# Patient Record
Sex: Female | Born: 1965 | Race: White | Hispanic: No | Marital: Married
Health system: Midwestern US, Community
[De-identification: ages and names within clinical notes are randomized; demographics above are authoritative.]

## PROBLEM LIST (undated history)

## (undated) DIAGNOSIS — F32A Depression, unspecified: Secondary | ICD-10-CM

## (undated) DIAGNOSIS — F329 Major depressive disorder, single episode, unspecified: Secondary | ICD-10-CM

## (undated) DIAGNOSIS — F909 Attention-deficit hyperactivity disorder, unspecified type: Secondary | ICD-10-CM

---

## 2014-03-21 NOTE — Progress Notes (Signed)
[facility] [address]   [address], [place], [place] [id number]  Phone:[phone]   Fax:[phone]  Outpatient PHYSICAL THERAPY: Initial Assessment  Fall Risk Score: 0 (? 5 = High Risk)    Treatment Diagnosis: pain in joint, lower leg  Treatment Diagnosis 2: stiffness of joint not elsewhere classified involving unspecified site  REFERRING PHYSICIAN: Herschel SenegalKoch, Benjamin S, MD  MD Orders: evaluate and treat left knee pain and left hip weakness   Return Physician Appointment: 1 month from evaluation  MEDICAL/REFERRING DIAGNOSIS: see above  DATE OF ONSET: chronic problem with recent exacerbation with initiating exercise   PRIOR LEVEL OF FUNCTION: able to walk and perform work activities without pain  PRECAUTIONS/ALLERGIES: Allergies no known allergies    ASSESSMENT:  ????????This section established at most recent assessment??????????  03/21/14: Pt presents to PT with report of L knee pain with signs and symptoms consistent with patellofemoral pain syndrome.  She reports long history of knee pain and joint stiffness that worsened recently when she tried to initiate working out. She demonstrates decreased knee flexion and extension ROM, decreased patellar mobility, and tenderness upon palpation of patellar tendon and fat pad.  She demonstrates decreased strength in R lower quarter, hip and knee, with mild tenderness upon palpation of greater trochanter bursa and gluteal tendons.  Pt with limitation in standing, walking, and exercise performance as result of above-listed impairments and with good prognosis for improvement with skilled PT.    PROBLEM LIST (Impairments causing functional limitations):  1. Decreased Strength affecting function  2. Decreased ADL/Functional Activities  3. Decreased Flexibility/joint mobility  4. Increased Pain affecting function  5. Decreased Activity tolerance   GOALS: (Goals have been discussed and agreed upon with patient.)  SHORT-TERM FUNCTIONAL GOALS: Time Frame:  by 04/18/14  1. Pt will report no limitation in standing for 1 hour due to pain.  2. Pt will report no pain with walking 20 minutes.   DISCHARGE GOALS: Time Frame: by 05/02/14  1. Pt will report no limitation in performing light workout for 1 hour.  2. Pt will demonstrate functional improvement with LEFS to 70/80.   REHABILITATION POTENTIAL FOR STATED GOALS: GoodPLAN OF CARE:  INTERVENTIONS PLANNED: (Benefits and precautions of physical therapy have been discussed with the patient.)  1. electrical stimulation  2. home exercise program (HEP)  3. manual therapy  4. range of motion: active/assisted/passive  5. therapeutic activities  6. therapeutic exercise/strengthening  TREATMENT PLAN EFFECTIVE DATES: 03/21/14 TO 05/02/14  FREQUENCY/DURATION: Follow patient 2 times a week for 6 weeks to address above goals.  Regarding Deissy M Zhan's therapy, I certify that the treatment plan above will be carried out by a therapist or under their direction.  Thank you for this referral,  Luiz IronLaurel D Byrdie Miyazaki, PT     Referring Physician Signature: Herschel SenegalKoch, Benjamin S, MD          Date                       SUBJECTIVE:  History of Present Injury/Illness (Reason for Referral): 03/21/14: Pt reports to therapy with chronic L knee pain that she has had for years, but worsened recently when she initiated workout at [employer].  She reports long history of not being able to bend or straighten her knee all the way.  She also reports L hip pain that she has had on and off for 9 years (since pregnancy).  She reports current pain 0/10, but pain that begins  with walking 10-15 minutes and limits her walking > 1 mile.  She reports pain with standing >1 hour.  She reports that pain progressively worsens when she is more active. Pt goals include returning to workout program.   Present Symptoms: as above 03/21/14   Pain Intensity 1: 0  Dominant Side: right  Past Medical History:   Past Medical History   Diagnosis Date   ??? Depressive disorder, not elsewhere classified     ??? Contact dermatitis and other eczema due to plants (except food)      Current Medications:   No current outpatient prescriptions on file prior to encounter.     No current facility-administered medications on file prior to encounter.      Date Last Reviewed: 03/21/2014    Social History/Home Situation: Pt lives in private home.       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   OBJECTIVE:  Outcome Measure:   Tool Used: Lower Extremity Functional Scale (LEFS)  Score:  Initial: 55/80 Most Recent: X/80 (Date: -- )   Interpretation of Score: 20 questions each scored on a 5 point scale with 0 representing "extreme difficulty or unable to perform" and 4 representing "no difficulty".  The lower the score, the greater the functional disability. 80/80 represents no disability.  Minimal detectable change is 9 points.  Score 80 79-63 62-48 47-32 31-16 15-1 0   Modifier CH CI CJ CK CL CM CN       Observation/palpation/joint mobility: Pt with tenderness upon palpation of left patellar tendon, fat pad, greater trochanteric bursa, and gluteal tendons.  Mild swelling noted in anterior knee.     Range of Motion:   Left* Right   Extension  +3 -4   Flexion  137 125     Strength:   Date: 03/21/14   Manual Muscle Test out of 5   Hip Flexion L: 3, R: 4/5   Hip Abduction L: 3, R: 3/5   Hip Extension Not tested   Knee Extension L: 4, R: 4+/5   Knee Flexion L: 4, R: 4/5      Functional Tests:    Overhead Squat: dysfunctional, non-painful: anterior lean, decreased dorsiflexion  Single Limb Squat: dysfunctional, non-painful: anterior lean, mild valgus  Single Limb Stance: 10 seconds ea LE    TREATMENT:    (In addition to Assessment/Re-Assessment sessions the following treatments were rendered)  Therapeutic Exercise: ( ):  Exercises per grid below to improve mobility, strength and dynamic movement of knee - left to improve functional bending, lifting and  walking.  Required minimal verbal cues to promote proper body mechanics.  Progressed range and repetitions as indicated.     03/21/14     Activity/Exercise Parameters               Parameters               Parameters                Patient education Plan of care, expectations for PT and returning to workout activities, knee anatomy     HEP Heel prop, heel slides with towel, patellar mobilizations                                       Manual Therapy (    Soft Tissue  Mobilization Duration  Duration: 15 Minutes): Manual techniques to facilitate improved motion and decreased pain.  ?? 4-way patellar mobilizations and edu for HEP performance   Therapeutic Modalities:                                                                                               HEP: As above; handouts given to patient for all exercises.  ______________________________________________________________________________________________________    Treatment Assessment:  Pt with good tolerance with initiation of manual techniques and therapeutic exercises.  Progression/Medical Necessity:   ?? Patient is expected to demonstrate progress in strength, range of motion and functional technique to increase independence with walking and working out.  Compliance with Program/Exercises: compliant most of the time.   Reason for Continuation of Services/Other Comments:  ?? Patient continues to require present interventions due to patient's inability to walk desired amounts .  Recommendations/Intent for next treatment session: "Treatment next visit will focus on advancements to more challenging activities".    Total Treatment Duration:  PT Patient Time In/Time Out  Time In: 1430  Time Out: 1530    Luiz Iron, PT

## 2014-03-21 NOTE — Progress Notes (Signed)
Ambulatory/Rehab Services H2 Model Falls Risk Assessment    Risk Factor Pts. ??   Confusion/Disorientation/Impulsivity  []    4 ??   Symptomatic Depression  []   2 ??   Altered Elimination  []   1 ??   Dizziness/Vertigo  []   1 ??   Gender (Female)  []   1 ??   Any administered antiepileptics (anticonvulsants):  []   2 ??   Any administered benzodiazepines:  []   1 ??   Visual Impairment (specify):  []   1 ??   Portable Oxygen Use  []   1 ??   Orthostatic ? BP  []   1 ??   History of Recent Falls (within 3 mos.)  []   5     Ability to Rise from Chair (choose one) Pts. ??   Ability to rise in a single movement  [x]   0 ??   Pushes up, successful in one attempt  []   1 ??   Multiple attempts, but successful  []   3 ??   Unable to rise without assistance  []   4   Total: (5 or greater = High Risk) 0     Falls Prevention Plan:   []                Physical Limitations to Exercise (specify):   []                Mobility Assistance Device (type):   []                Exercise/Equipment Adaptation (specify):    ??2010 [employer] All Rights Reserved. [place] Patent #7,[id number]. Federal Law prohibits the replication, distribution or use without written permission from [employer]

## 2014-03-27 NOTE — Progress Notes (Signed)
[facility] [address]   [address], [place], [place] [id number]  Phone:[phone]   [phone]  Outpatient PHYSICAL THERAPY: Daily Note  Fall Risk Score: 0 (? 5 = High Risk)      Treatment Diagnosis: pain in joint, lower leg  Treatment Diagnosis 2: stiffness of joint not elsewhere classified involving unspecified site  REFERRING PHYSICIAN: Herschel SenegalKoch, Benjamin S, MD  MD Orders: evaluate and treat left knee pain and left hip weakness   Return Physician Appointment: 1 month from evaluation  MEDICAL/REFERRING DIAGNOSIS: see above  DATE OF ONSET: chronic problem with recent exacerbation with initiating exercise   PRIOR LEVEL OF FUNCTION: able to walk and perform work activities without pain  PRECAUTIONS/ALLERGIES: Allergies no known allergies    ASSESSMENT:  ????????This section established at most recent assessment??????????  03/21/14: Pt presents to PT with report of L knee pain with signs and symptoms consistent with patellofemoral pain syndrome.  She reports long history of knee pain and joint stiffness that worsened recently when she tried to initiate working out. She demonstrates decreased knee flexion and extension ROM, decreased patellar mobility, and tenderness upon palpation of patellar tendon and fat pad.  She demonstrates decreased strength in R lower quarter, hip and knee, with mild tenderness upon palpation of greater trochanter bursa and gluteal tendons.  Pt with limitation in standing, walking, and exercise performance as result of above-listed impairments and with good prognosis for improvement with skilled PT.    PROBLEM LIST (Impairments causing functional limitations):  1. Decreased Strength affecting function  2. Decreased ADL/Functional Activities  3. Decreased Flexibility/joint mobility  4. Increased Pain affecting function  5. Decreased Activity tolerance   GOALS: (Goals have been discussed and agreed upon with patient.)  SHORT-TERM FUNCTIONAL GOALS: Time Frame: by  04/18/14  1. Pt will report no limitation in standing for 1 hour due to pain. ONGOING  2. Pt will report no pain with walking 20 minutes. ONGOING  DISCHARGE GOALS: Time Frame: by 05/02/14  1. Pt will report no limitation in performing light workout for 1 hour. ONGOING  2. Pt will demonstrate functional improvement with LEFS to 70/80. ONGOING  REHABILITATION POTENTIAL FOR STATED GOALS: GoodPLAN OF CARE:  INTERVENTIONS PLANNED: (Benefits and precautions of physical therapy have been discussed with the patient.)  1. electrical stimulation  2. home exercise program (HEP)  3. manual therapy  4. range of motion: active/assisted/passive  5. therapeutic activities  6. therapeutic exercise/strengthening  TREATMENT PLAN EFFECTIVE DATES: 03/21/14 TO 05/02/14  FREQUENCY/DURATION: Follow patient 2 times a week for 6 weeks to address above goals.  Regarding Kristina Huber's therapy, I certify that the treatment plan above will be carried out by a therapist or under their direction.  Thank you for this referral,  Kristina Huber, PT     Referring Physician Signature: Herschel SenegalKoch, Benjamin S, MD          Date                       SUBJECTIVE:  History of Present Injury/Illness (Reason for Referral): 03/21/14: Pt reports to therapy with chronic L knee pain that she has had for years, but worsened recently when she initiated workout at [employer].  She reports long history of not being able to bend or straighten her knee all the way.  She also reports L hip pain that she has had on and off for 9 years (since pregnancy).  She reports current pain 0/10,  but pain that begins with walking 10-15 minutes and limits her walking > 1 mile.  She reports pain with standing >1 hour.  She reports that pain progressively worsens when she is more active. Pt goals include returning to workout program.   Present Symptoms: Pt reports that she has not been able to do her HEP consistently, but that it has seemed to help improve her knee extension despite that.   Pain  Intensity 1: 0  Dominant Side: right  Past Medical History:   Past Medical History   Diagnosis Date   ??? Depressive disorder, not elsewhere classified    ??? Contact dermatitis and other eczema due to plants (except food)      Current Medications:   No current outpatient prescriptions on file prior to encounter.     No current facility-administered medications on file prior to encounter.      Date Last Reviewed: 03/27/2014    Social History/Home Situation: Pt lives in private home.       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   OBJECTIVE:  Outcome Measure:   Tool Used: Lower Extremity Functional Scale (LEFS)  Score:  Initial: 55/80 Most Recent: X/80 (Date: -- )   Interpretation of Score: 20 questions each scored on a 5 point scale with 0 representing "extreme difficulty or unable to perform" and 4 representing "no difficulty".  The lower the score, the greater the functional disability. 80/80 represents no disability.  Minimal detectable change is 9 points.  Score 80 79-63 62-48 47-32 31-16 15-1 0   Modifier CH CI CJ CK CL CM CN       Observation/palpation/joint mobility: Pt with tenderness upon palpation of left patellar tendon, fat pad, greater trochanteric bursa, and gluteal tendons.  Mild swelling noted in anterior knee.     Range of Motion:   Left* Right   Extension  0 +3   Flexion  140 137     Strength:   Date: 03/21/14   Manual Muscle Test out of 5   Hip Flexion L: 3, R: 4/5   Hip Abduction L: 3, R: 3/5   Hip Extension Not tested   Knee Extension L: 4, R: 4+/5   Knee Flexion L: 4, R: 4/5      Functional Tests:    Overhead Squat: dysfunctional, non-painful: anterior lean, decreased dorsiflexion  Single Limb Squat: dysfunctional, non-painful: anterior lean, mild valgus  Single Limb Stance: 10 seconds ea LE    TREATMENT:    (In addition to Assessment/Re-Assessment sessions the following treatments were rendered)  Therapeutic  Exercise: (30 Minutes):  Exercises per grid below to improve mobility, strength and dynamic movement of knee - left to improve functional bending, lifting and walking.  Required minimal verbal cues to promote proper body mechanics.  Progressed range and repetitions as indicated.     03/21/14 03/27/14    Activity/Exercise Parameters               Parameters               Parameters                Patient education Plan of care, expectations for PT and returning to workout activities, knee anatomy --    HEP Heel prop, heel slides with towel, patellar mobilizations Added SLR flexion and abduction    Heel prop -- 3 mins    Heel slides --  3 mins    Quad set -- 10 reps x 5 sec (mild discomfort)     SLR flexion -- 10 reps (discomfort)    SLR abduction -- 2 x 10 (fatigue)    Clam shells -- 3 x 10    Prone TKE -- 3 x 10     Knee/hip flexion with heels on ball -- 2 mins               Manual Therapy (    Soft Tissue Mobilization Duration  Duration: 15 Minutes): Manual techniques to facilitate improved motion and decreased pain.  ?? 4-way patellar mobilizations and edu for HEP performance   ?? Anterior interval mobilization   Therapeutic Modalities:                                                                                               HEP: As above; handouts given to patient for all exercises.  ______________________________________________________________________________________________________    Treatment Assessment:  Pt with significantly improved ROM demonstrated this visit.  Pt with mild discomfort in patella and quad with SLR and quad setting today.  (ROM measurements were documented on wrong side last visit and corrected today.)   Progression/Medical Necessity:   ?? Patient is expected to demonstrate progress in strength, range of motion and functional technique to increase independence with walking and working out.  Compliance with Program/Exercises: compliant most of the time.   Reason for Continuation of  Services/Other Comments:  ?? Patient continues to require present interventions due to patient's inability to walk desired amounts .  Recommendations/Intent for next treatment session: "Treatment next visit will focus on advancements to more challenging activities".    Total Treatment Duration:  PT Patient Time In/Time Out  Time In: 1335  Time Out: 1420    Kristina Huber, PT

## 2014-03-29 NOTE — Progress Notes (Signed)
[facility]rapy Center at [facility]   [address] [place], [place] [address]  Phone:[phone]   [phone]    DATE: 03/29/2014    Patient canceled for appointment today due to sick child.  Will plan to follow up on next scheduled visit.      Teddy Spike, PT, DPT, OCS

## 2014-04-03 NOTE — Progress Notes (Signed)
[facility] [address]   [address], [place], [place] [address]  Phone:[phone]   [phone]  Outpatient PHYSICAL THERAPY: Daily Note  Fall Risk Score: 0 (? 5 = High Risk)      Treatment Diagnosis: pain in joint, lower leg  Treatment Diagnosis 2: stiffness of joint not elsewhere classified involving unspecified site  REFERRING PHYSICIAN: Herschel SenegalKoch, Benjamin S, MD  MD Orders: evaluate and treat left knee pain and left hip weakness   Return Physician Appointment: 1 month from evaluation  MEDICAL/REFERRING DIAGNOSIS: see above  DATE OF ONSET: chronic problem with recent exacerbation with initiating exercise   PRIOR LEVEL OF FUNCTION: able to walk and perform work activities without pain  PRECAUTIONS/ALLERGIES: Allergies no known allergies    ASSESSMENT:  ????????This section established at most recent assessment??????????  03/21/14: Pt presents to PT with report of L knee pain with signs and symptoms consistent with patellofemoral pain syndrome.  She reports long history of knee pain and joint stiffness that worsened recently when she tried to initiate working out. She demonstrates decreased knee flexion and extension ROM, decreased patellar mobility, and tenderness upon palpation of patellar tendon and fat pad.  She demonstrates decreased strength in R lower quarter, hip and knee, with mild tenderness upon palpation of greater trochanter bursa and gluteal tendons.  Pt with limitation in standing, walking, and exercise performance as result of above-listed impairments and with good prognosis for improvement with skilled PT.    PROBLEM LIST (Impairments causing functional limitations):  1. Decreased Strength affecting function  2. Decreased ADL/Functional Activities  3. Decreased Flexibility/joint mobility  4. Increased Pain affecting function  5. Decreased Activity tolerance   GOALS: (Goals have been discussed and agreed upon with patient.)  SHORT-TERM FUNCTIONAL GOALS: Time Frame: by  04/18/14  1. Pt will report no limitation in standing for 1 hour due to pain. ONGOING  2. Pt will report no pain with walking 20 minutes. ONGOING  DISCHARGE GOALS: Time Frame: by 05/02/14  1. Pt will report no limitation in performing light workout for 1 hour. ONGOING  2. Pt will demonstrate functional improvement with LEFS to 70/80. ONGOING  REHABILITATION POTENTIAL FOR STATED GOALS: GoodPLAN OF CARE:  INTERVENTIONS PLANNED: (Benefits and precautions of physical therapy have been discussed with the patient.)  1. electrical stimulation  2. home exercise program (HEP)  3. manual therapy  4. range of motion: active/assisted/passive  5. therapeutic activities  6. therapeutic exercise/strengthening  TREATMENT PLAN EFFECTIVE DATES: 03/21/14 TO 05/02/14  FREQUENCY/DURATION: Follow patient 2 times a week for 6 weeks to address above goals.  Regarding Kalla M Griesinger's therapy, I certify that the treatment plan above will be carried out by a therapist or under their direction.  Thank you for this referral,  Luiz IronLaurel D Renie Stelmach, PT     Referring Physician Signature: Herschel SenegalKoch, Benjamin S, MD          Date                       SUBJECTIVE:  History of Present Injury/Illness (Reason for Referral): 03/21/14: Pt reports to therapy with chronic L knee pain that she has had for years, but worsened recently when she initiated workout at [employer].  She reports long history of not being able to bend or straighten her knee all the way.  She also reports L hip pain that she has had on and off for 9 years (since pregnancy).  She reports current pain 0/10,  but pain that begins with walking 10-15 minutes and limits her walking > 1 mile.  She reports pain with standing >1 hour.  She reports that pain progressively worsens when she is more active. Pt goals include returning to workout program.   Present Symptoms: Pt reports that she has had some increased pain since her last visit and that she is not sure if it is from the therapy or other activities.      Pain Intensity 1: 2  Dominant Side: right  Past Medical History:   Past Medical History   Diagnosis Date   ??? Depressive disorder, not elsewhere classified    ??? Contact dermatitis and other eczema due to plants (except food)      Current Medications:   No current outpatient prescriptions on file prior to encounter.     No current facility-administered medications on file prior to encounter.      Date Last Reviewed: 04/03/2014    Social History/Home Situation: Pt lives in private home.       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   OBJECTIVE:  Outcome Measure:   Tool Used: Lower Extremity Functional Scale (LEFS)  Score:  Initial: 55/80 Most Recent: X/80 (Date: -- )   Interpretation of Score: 20 questions each scored on a 5 point scale with 0 representing "extreme difficulty or unable to perform" and 4 representing "no difficulty".  The lower the score, the greater the functional disability. 80/80 represents no disability.  Minimal detectable change is 9 points.  Score 80 79-63 62-48 47-32 31-16 15-1 0   Modifier CH CI CJ CK CL CM CN       Observation/palpation/joint mobility: Pt with tenderness upon palpation of left patellar tendon, fat pad, greater trochanteric bursa, and gluteal tendons.  Mild swelling noted in anterior knee.     Range of Motion:   Left* Right   Extension  0 +3   Flexion  140 137     Strength:   Date: 03/21/14   Manual Muscle Test out of 5   Hip Flexion L: 3, R: 4/5   Hip Abduction L: 3, R: 3/5   Hip Extension Not tested   Knee Extension L: 4, R: 4+/5   Knee Flexion L: 4, R: 4/5      Functional Tests:    Overhead Squat: dysfunctional, non-painful: anterior lean, decreased dorsiflexion  Single Limb Squat: dysfunctional, non-painful: anterior lean, mild valgus  Single Limb Stance: 10 seconds ea LE    TREATMENT:    (In addition to Assessment/Re-Assessment sessions the following treatments were rendered)   Therapeutic Exercise: (25 Minutes):  Exercises per grid below to improve mobility, strength and dynamic movement of knee - left to improve functional bending, lifting and walking.  Required minimal verbal cues to promote proper body mechanics.  Progressed range and repetitions as indicated.     03/21/14 03/27/14 04/03/14   Activity/Exercise Parameters               Parameters               Parameters                Patient education Plan of care, expectations for PT and returning to workout activities, knee anatomy -- --   HEP Heel prop, heel slides with towel, patellar mobilizations Added SLR flexion and abduction --   Heel prop -- 3 mins --  Heel slides -- 3 mins 2 mins   ITB stretch -- -- Green strap 2 x 30 sec   Calf stretch -- -- Towel stretch, 3 x 30 sec   Quad set -- 10 reps x 5 sec (mild discomfort)  --   SLR flexion -- 10 reps (discomfort) 3 x 10    SLR abduction -- 2 x 10 (fatigue) 3 x 10    Clam shells -- 3 x 10 3 x 10   bridges -- -- 3 x 10   Prone TKE -- 3 x 10  --   Knee/hip flexion with heels on ball -- 2 mins  2 mins   TKE ball on wall -- -- 2 x 10    Shuttle  -- -- Single leg: 3 x 10, 4 cords        Manual Therapy (    Soft Tissue Mobilization Duration  Duration: 15 Minutes): Manual techniques to facilitate improved motion and decreased pain.  ?? 4-way patellar mobilizations and edu for HEP performance   ?? Anterior interval mobilization   Therapeutic Modalities: Ice Massage x 5 mins                           Left Knee Cold  Type: Cold/ice pack                                                                  HEP: As above; handouts given to patient for all exercises.  ______________________________________________________________________________________________________    Treatment Assessment:  Pt with decreased pain following manual techniques and stretching this visit.  Good tolerance with OKC, mild pain with CKC. Pt was able to perform shuttle exercises in modified range without pain.  Treatment  ended with ice massage to help manage inflammatory response from exercises.  Progression/Medical Necessity:   ?? Patient is expected to demonstrate progress in strength, range of motion and functional technique to increase independence with walking and working out.  Compliance with Program/Exercises: compliant most of the time.   Reason for Continuation of Services/Other Comments:  ?? Patient continues to require present interventions due to patient's inability to walk desired amounts .  Recommendations/Intent for next treatment session: "Treatment next visit will focus on advancements to more challenging activities".    Total Treatment Duration:  PT Patient Time In/Time Out  Time In: 1330  Time Out: 1415    Luiz Iron, PT

## 2014-04-05 NOTE — Progress Notes (Signed)
[facility] [address]   [address], [place], [place] [address]  Phone:[phone]   [phone]  Outpatient PHYSICAL THERAPY: Daily Note  Fall Risk Score: 0 (? 5 = High Risk)      Treatment Diagnosis: pain in joint, lower leg  Treatment Diagnosis 2: stiffness of joint not elsewhere classified involving unspecified site  REFERRING PHYSICIAN: Herschel SenegalKoch, Benjamin S, MD  MD Orders: evaluate and treat left knee pain and left hip weakness   Return Physician Appointment: 1 month from evaluation  MEDICAL/REFERRING DIAGNOSIS: see above  DATE OF ONSET: chronic problem with recent exacerbation with initiating exercise   PRIOR LEVEL OF FUNCTION: able to walk and perform work activities without pain  PRECAUTIONS/ALLERGIES: Allergies no known allergies    ASSESSMENT:  ????????This section established at most recent assessment??????????  03/21/14: Pt presents to PT with report of L knee pain with signs and symptoms consistent with patellofemoral pain syndrome.  She reports long history of knee pain and joint stiffness that worsened recently when she tried to initiate working out. She demonstrates decreased knee flexion and extension ROM, decreased patellar mobility, and tenderness upon palpation of patellar tendon and fat pad.  She demonstrates decreased strength in R lower quarter, hip and knee, with mild tenderness upon palpation of greater trochanter bursa and gluteal tendons.  Pt with limitation in standing, walking, and exercise performance as result of above-listed impairments and with good prognosis for improvement with skilled PT.    PROBLEM LIST (Impairments causing functional limitations):  1. Decreased Strength affecting function  2. Decreased ADL/Functional Activities  3. Decreased Flexibility/joint mobility  4. Increased Pain affecting function  5. Decreased Activity tolerance   GOALS: (Goals have been discussed and agreed upon with patient.)  SHORT-TERM FUNCTIONAL GOALS: Time Frame: by  04/18/14  1. Pt will report no limitation in standing for 1 hour due to pain. ONGOING  2. Pt will report no pain with walking 20 minutes. ONGOING  DISCHARGE GOALS: Time Frame: by 05/02/14  1. Pt will report no limitation in performing light workout for 1 hour. ONGOING  2. Pt will demonstrate functional improvement with LEFS to 70/80. ONGOING  REHABILITATION POTENTIAL FOR STATED GOALS: GoodPLAN OF CARE:  INTERVENTIONS PLANNED: (Benefits and precautions of physical therapy have been discussed with the patient.)  1. electrical stimulation  2. home exercise program (HEP)  3. manual therapy  4. range of motion: active/assisted/passive  5. therapeutic activities  6. therapeutic exercise/strengthening  TREATMENT PLAN EFFECTIVE DATES: 03/21/14 TO 05/02/14  FREQUENCY/DURATION: Follow patient 2 times a week for 6 weeks to address above goals.  Regarding Kalla M Griesinger's therapy, I certify that the treatment plan above will be carried out by a therapist or under their direction.  Thank you for this referral,  Luiz IronLaurel D Renie Stelmach, PT     Referring Physician Signature: Herschel SenegalKoch, Benjamin S, MD          Date                       SUBJECTIVE:  History of Present Injury/Illness (Reason for Referral): 03/21/14: Pt reports to therapy with chronic L knee pain that she has had for years, but worsened recently when she initiated workout at [employer].  She reports long history of not being able to bend or straighten her knee all the way.  She also reports L hip pain that she has had on and off for 9 years (since pregnancy).  She reports current pain 0/10,  but pain that begins with walking 10-15 minutes and limits her walking > 1 mile.  She reports pain with standing >1 hour.  She reports that pain progressively worsens when she is more active. Pt goals include returning to workout program.   Present Symptoms: Pt reports that she has not had the increased pain she was experiencing before her last visit since she was last in therapy.  She reports that her  pain is improving overall.    Pain Intensity 1: 0  Dominant Side: right  Past Medical History:   Past Medical History   Diagnosis Date   ??? Depressive disorder, not elsewhere classified    ??? Contact dermatitis and other eczema due to plants (except food)      Current Medications:   No current outpatient prescriptions on file prior to encounter.     No current facility-administered medications on file prior to encounter.      Date Last Reviewed: 04/05/2014    Social History/Home Situation: Pt lives in private home.       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   OBJECTIVE:  Outcome Measure:   Tool Used: Lower Extremity Functional Scale (LEFS)  Score:  Initial: 55/80 Most Recent: X/80 (Date: -- )   Interpretation of Score: 20 questions each scored on a 5 point scale with 0 representing "extreme difficulty or unable to perform" and 4 representing "no difficulty".  The lower the score, the greater the functional disability. 80/80 represents no disability.  Minimal detectable change is 9 points.  Score 80 79-63 62-48 47-32 31-16 15-1 0   Modifier CH CI CJ CK CL CM CN       Observation/palpation/joint mobility: Pt with tenderness upon palpation of left patellar tendon, fat pad, greater trochanteric bursa, and gluteal tendons.  Mild swelling noted in anterior knee.     Range of Motion:   Left* Right   Extension  0 +3   Flexion  140 137     Strength:   Date: 03/21/14   Manual Muscle Test out of 5   Hip Flexion L: 3, R: 4/5   Hip Abduction L: 3, R: 3/5   Hip Extension Not tested   Knee Extension L: 4, R: 4+/5   Knee Flexion L: 4, R: 4/5      Functional Tests:    Overhead Squat: dysfunctional, non-painful: anterior lean, decreased dorsiflexion  Single Limb Squat: dysfunctional, non-painful: anterior lean, mild valgus  Single Limb Stance: 10 seconds ea LE    TREATMENT:    (In addition to Assessment/Re-Assessment sessions the following  treatments were rendered)  Therapeutic Exercise: (40 Minutes):  Exercises per grid below to improve mobility, strength and dynamic movement of knee - left to improve functional bending, lifting and walking.  Required minimal verbal cues to promote proper body mechanics.  Progressed range and repetitions as indicated.     04/03/14 04/05/14    Activity/Exercise Parameters                  Patient education -- Reviewed HEP    Heel slides 2 mins 2 mins    ITB stretch Green strap 2 x 30 sec --    Calf stretch Towel stretch, 3 x 30 sec Towel stretch, 3 x 30 sec    Quad set -- --    SLR flexion 3 x 10  3 x 10    SLR abduction 3  x 10  3 x 10    Clam shells 3 x 10 --    bridges 3 x 10 Heels on ball: 3 x 10    Prone TKE -- --    Knee/hip flexion with heels on ball 2 mins 2 mins    TKE ball on wall 2 x 10  3 x 10    Shuttle  Single leg: 3 x 10, 4 cords  Single leg: 3 x 10, 4 cords    squats -- To mat table: 3 x 10     Sidestepping and monsterwalking -- 2 laps x 20 feet, red tB        Manual Therapy (    Soft Tissue Mobilization Duration  Duration: 15 Minutes): Manual techniques to facilitate improved motion and decreased pain.  ?? 4-way patellar mobilizations and edu for HEP performance   ?? Patellar pinch taping for patellar tendon/anterior interval mobility  ?? Anterior interval mobilization   Therapeutic Modalities: Ice Massage x 5 mins                                                                                              HEP: As above; handouts given to patient for all exercises.  ______________________________________________________________________________________________________    Treatment Assessment:  Pt with no pain today before or after today's treatment.  Pt with good tolerance with increased CKC exercises and initiation of taping.  Plan to progress as tolerated next visit.  Consider step ups.   Progression/Medical Necessity:   ?? Patient is expected to demonstrate progress in strength, range of motion and  functional technique to increase independence with walking and working out.  Compliance with Program/Exercises: compliant most of the time.   Reason for Continuation of Services/Other Comments:  ?? Patient continues to require present interventions due to patient's inability to walk desired amounts .  Recommendations/Intent for next treatment session: "Treatment next visit will focus on advancements to more challenging activities".    Total Treatment Duration:  PT Patient Time In/Time Out  Time In: 1335  Time Out: 1430    Luiz Iron, PT

## 2014-04-10 NOTE — Progress Notes (Signed)
Therapy Center at [facility] [address]   [address] [place], [place] [address]  Phone:[phone]   [phone]    DATE: 04/10/2014    Patient canceled for appointment today due to unknown reasons.  Will plan to follow up on next scheduled visit.      Teddy Spike, PT, DPT, OCS

## 2014-04-17 NOTE — Progress Notes (Signed)
[facility] [address]   [address], [place], [place] [address]  Phone:[phone]   [phone]  Outpatient PHYSICAL THERAPY: Daily Note  Fall Risk Score: 0 (? 5 = High Risk)      Treatment Diagnosis: pain in joint, lower leg  Treatment Diagnosis 2: stiffness of joint not elsewhere classified involving unspecified site  REFERRING PHYSICIAN: Herschel SenegalKoch, Benjamin S, MD  MD Orders: evaluate and treat left knee pain and left hip weakness   Return Physician Appointment: 1 month from evaluation  MEDICAL/REFERRING DIAGNOSIS: see above  DATE OF ONSET: chronic problem with recent exacerbation with initiating exercise   PRIOR LEVEL OF FUNCTION: able to walk and perform work activities without pain  PRECAUTIONS/ALLERGIES: Allergies no known allergies    ASSESSMENT:  ????????This section established at most recent assessment??????????  03/21/14: Pt presents to PT with report of L knee pain with signs and symptoms consistent with patellofemoral pain syndrome.  She reports long history of knee pain and joint stiffness that worsened recently when she tried to initiate working out. She demonstrates decreased knee flexion and extension ROM, decreased patellar mobility, and tenderness upon palpation of patellar tendon and fat pad.  She demonstrates decreased strength in R lower quarter, hip and knee, with mild tenderness upon palpation of greater trochanter bursa and gluteal tendons.  Pt with limitation in standing, walking, and exercise performance as result of above-listed impairments and with good prognosis for improvement with skilled PT.    PROBLEM LIST (Impairments causing functional limitations):  1. Decreased Strength affecting function  2. Decreased ADL/Functional Activities  3. Decreased Flexibility/joint mobility  4. Increased Pain affecting function  5. Decreased Activity tolerance   GOALS: (Goals have been discussed and agreed upon with patient.)  SHORT-TERM FUNCTIONAL GOALS: Time Frame: by  04/18/14  1. Pt will report no limitation in standing for 1 hour due to pain. ONGOING  2. Pt will report no pain with walking 20 minutes. ONGOING  DISCHARGE GOALS: Time Frame: by 05/02/14  1. Pt will report no limitation in performing light workout for 1 hour. ONGOING  2. Pt will demonstrate functional improvement with LEFS to 70/80. ONGOING  REHABILITATION POTENTIAL FOR STATED GOALS: GoodPLAN OF CARE:  INTERVENTIONS PLANNED: (Benefits and precautions of physical therapy have been discussed with the patient.)  1. electrical stimulation  2. home exercise program (HEP)  3. manual therapy  4. range of motion: active/assisted/passive  5. therapeutic activities  6. therapeutic exercise/strengthening  TREATMENT PLAN EFFECTIVE DATES: 03/21/14 TO 05/02/14  FREQUENCY/DURATION: Follow patient 2 times a week for 6 weeks to address above goals.  Regarding Kalla M Griesinger's therapy, I certify that the treatment plan above will be carried out by a therapist or under their direction.  Thank you for this referral,  Luiz IronLaurel D Renie Stelmach, PT     Referring Physician Signature: Herschel SenegalKoch, Benjamin S, MD          Date                       SUBJECTIVE:  History of Present Injury/Illness (Reason for Referral): 03/21/14: Pt reports to therapy with chronic L knee pain that she has had for years, but worsened recently when she initiated workout at [employer].  She reports long history of not being able to bend or straighten her knee all the way.  She also reports L hip pain that she has had on and off for 9 years (since pregnancy).  She reports current pain 0/10,  but pain that begins with walking 10-15 minutes and limits her walking > 1 mile.  She reports pain with standing >1 hour.  She reports that pain progressively worsens when she is more active. Pt goals include returning to workout program.   Present Symptoms: Pt reports that she has not been doing her home exercises for the last week or so and that her knee is a little sore. She reports that it moves  better than it did before she started therapy, but that it is a little more sore.    Pain Intensity 1: 2  Dominant Side: right  Past Medical History:   Past Medical History   Diagnosis Date   ??? Depressive disorder, not elsewhere classified    ??? Contact dermatitis and other eczema due to plants (except food)      Current Medications:   No current outpatient prescriptions on file prior to encounter.     No current facility-administered medications on file prior to encounter.      Date Last Reviewed: 04/17/2014    Social History/Home Situation: Pt lives in private home.       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   OBJECTIVE:  Outcome Measure:   Tool Used: Lower Extremity Functional Scale (LEFS)  Score:  Initial: 55/80 Most Recent: X/80 (Date: -- )   Interpretation of Score: 20 questions each scored on a 5 point scale with 0 representing "extreme difficulty or unable to perform" and 4 representing "no difficulty".  The lower the score, the greater the functional disability. 80/80 represents no disability.  Minimal detectable change is 9 points.  Score 80 79-63 62-48 47-32 31-16 15-1 0   Modifier CH CI CJ CK CL CM CN       Observation/palpation/joint mobility: Pt with tenderness upon palpation of left patellar tendon, fat pad, greater trochanteric bursa, and gluteal tendons.  Mild swelling noted in anterior knee.     Range of Motion:   Left* Right   Extension  0 +3   Flexion  140 137     Strength:   Date: 03/21/14   Manual Muscle Test out of 5   Hip Flexion L: 3, R: 4/5   Hip Abduction L: 3, R: 3/5   Hip Extension Not tested   Knee Extension L: 4, R: 4+/5   Knee Flexion L: 4, R: 4/5      Functional Tests:    Overhead Squat: dysfunctional, non-painful: anterior lean, decreased dorsiflexion  Single Limb Squat: dysfunctional, non-painful: anterior lean, mild valgus  Single Limb Stance: 10 seconds ea LE    TREATMENT:    (In addition to  Assessment/Re-Assessment sessions the following treatments were rendered)  Therapeutic Exercise: (40 Minutes):  Exercises per grid below to improve mobility, strength and dynamic movement of knee - left to improve functional bending, lifting and walking.  Required minimal verbal cues to promote proper body mechanics.  Progressed range and repetitions as indicated.     04/03/14 04/05/14 04/17/14   Activity/Exercise Parameters                  Patient education -- Reviewed HEP --   Heel slides 2 mins 2 mins 2 mins   ITB stretch Green strap 2 x 30 sec -- --   Adductor stretch -- -- Standing lunge: 3 x 30 sec hold    Calf stretch Towel stretch, 3 x 30 sec Towel stretch,  3 x 30 sec --   SLR flexion 3 x 10  3 x 10 3 x 10   SLR abduction 3 x 10  3 x 10 3 x 10   Clam shells 3 x 10 -- --   bridges 3 x 10 Heels on ball: 3 x 10 --   Knee/hip flexion with heels on ball 2 mins 2 mins --   TKE ball on wall 2 x 10  3 x 10 3 x 10 green strap   Shuttle  Single leg: 3 x 10, 4 cords  Single leg: 3 x 10, 4 cords Single leg: 3 x 10, 4 cords   squats -- To mat table: 3 x 10  To mat table: 3 x 10   Sidestepping and monsterwalking -- 2 laps x 20 feet, red tB 2 laps x 20 feet, red tB       Manual Therapy (    Soft Tissue Mobilization Duration  Duration: 20 Minutes): Manual techniques to facilitate improved motion and decreased pain.  ?? 4-way patellar mobilizations and edu for HEP performance   ?? Patellar pinch taping for patellar tendon/anterior interval mobility  ?? Anterior interval mobilization   ?? Anterior tibial mobilization in flexion and approaching extension (improved anterior interval pain)  Therapeutic Modalities: Ice Massage x 5 mins                                                                                              HEP: As above; handouts given to patient for all exercises.  ______________________________________________________________________________________________________    Treatment Assessment:  Pt with slightly  increased pain compared to last visit, potentially due to low compliance with home program.  Pt educated in benefits of regular performance and she agreed to begin performing.  Pt with improved pain with anterior tibial mobilizations for terminal knee extension and anterior knee pain.   Progression/Medical Necessity:   ?? Patient is expected to demonstrate progress in strength, range of motion and functional technique to increase independence with walking and working out.  Compliance with Program/Exercises: compliant most of the time.   Reason for Continuation of Services/Other Comments:  ?? Patient continues to require present interventions due to patient's inability to walk desired amounts .  Recommendations/Intent for next treatment session: "Treatment next visit will focus on advancements to more challenging activities".    Total Treatment Duration:  PT Patient Time In/Time Out  Time In: 1330  Time Out: 1430    Luiz IronLaurel D Ruvi Fullenwider, PT

## 2014-04-19 NOTE — Progress Notes (Signed)
[facility] [address]   [address], [place], [place] [id number]  Phone:[phone]   [phone]  Outpatient PHYSICAL THERAPY: Daily Note  Fall Risk Score: 0 (? 5 = High Risk)      Treatment Diagnosis: pain in joint, lower leg  Treatment Diagnosis 2: stiffness of joint not elsewhere classified involving unspecified site  REFERRING PHYSICIAN: Herschel SenegalKoch, Benjamin S, MD  MD Orders: evaluate and treat left knee pain and left hip weakness   Return Physician Appointment: 1 month from evaluation  MEDICAL/REFERRING DIAGNOSIS: see above  DATE OF ONSET: chronic problem with recent exacerbation with initiating exercise   PRIOR LEVEL OF FUNCTION: able to walk and perform work activities without pain  PRECAUTIONS/ALLERGIES: Allergies no known allergies    ASSESSMENT:  ????????This section established at most recent assessment??????????  03/21/14: Pt presents to PT with report of L knee pain with signs and symptoms consistent with patellofemoral pain syndrome.  She reports long history of knee pain and joint stiffness that worsened recently when she tried to initiate working out. She demonstrates decreased knee flexion and extension ROM, decreased patellar mobility, and tenderness upon palpation of patellar tendon and fat pad.  She demonstrates decreased strength in R lower quarter, hip and knee, with mild tenderness upon palpation of greater trochanter bursa and gluteal tendons.  Pt with limitation in standing, walking, and exercise performance as result of above-listed impairments and with good prognosis for improvement with skilled PT.    PROBLEM LIST (Impairments causing functional limitations):  1. Decreased Strength affecting function  2. Decreased ADL/Functional Activities  3. Decreased Flexibility/joint mobility  4. Increased Pain affecting function  5. Decreased Activity tolerance   GOALS: (Goals have been discussed and agreed upon with patient.)  SHORT-TERM FUNCTIONAL GOALS: Time Frame: by  04/18/14  1. Pt will report no limitation in standing for 1 hour due to pain. ONGOING  2. Pt will report no pain with walking 20 minutes. ONGOING  DISCHARGE GOALS: Time Frame: by 05/02/14  1. Pt will report no limitation in performing light workout for 1 hour. ONGOING  2. Pt will demonstrate functional improvement with LEFS to 70/80. ONGOING  REHABILITATION POTENTIAL FOR STATED GOALS: GoodPLAN OF CARE:  INTERVENTIONS PLANNED: (Benefits and precautions of physical therapy have been discussed with the patient.)  1. electrical stimulation  2. home exercise program (HEP)  3. manual therapy  4. range of motion: active/assisted/passive  5. therapeutic activities  6. therapeutic exercise/strengthening  TREATMENT PLAN EFFECTIVE DATES: 03/21/14 TO 05/02/14  FREQUENCY/DURATION: Follow patient 2 times a week for 6 weeks to address above goals.  Regarding Kristina Huber's therapy, I certify that the treatment plan above will be carried out by a therapist or under their direction.  Thank you for this referral,  Luiz IronLaurel D Renie Stelmach, PT     Referring Physician Signature: Herschel SenegalKoch, Benjamin S, MD          Date                       SUBJECTIVE:  History of Present Injury/Illness (Reason for Referral): 03/21/14: Pt reports to therapy with chronic L knee pain that she has had for years, but worsened recently when she initiated workout at [employer].  She reports long history of not being able to bend or straighten her knee all the way.  She also reports L hip pain that she has had on and off for 9 years (since pregnancy).  She reports current pain 0/10,  but pain that begins with walking 10-15 minutes and limits her walking > 1 mile.  She reports pain with standing >1 hour.  She reports that pain progressively worsens when she is more active. Pt goals include returning to workout program.   Present Symptoms: Pt reports that her knee is sore this AM and she does not understand why it gets tight.   Pain Intensity 1: 2  Dominant Side: right  Past Medical  History:   Past Medical History   Diagnosis Date   ??? Depressive disorder, not elsewhere classified    ??? Contact dermatitis and other eczema due to plants (except food)      Current Medications:   No current outpatient prescriptions on file prior to encounter.     No current facility-administered medications on file prior to encounter.      Date Last Reviewed: 04/19/2014    Social History/Home Situation: Pt lives in private home.       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   OBJECTIVE:  Outcome Measure:   Tool Used: Lower Extremity Functional Scale (LEFS)  Score:  Initial: 55/80 Most Recent: X/80 (Date: -- )   Interpretation of Score: 20 questions each scored on a 5 point scale with 0 representing "extreme difficulty or unable to perform" and 4 representing "no difficulty".  The lower the score, the greater the functional disability. 80/80 represents no disability.  Minimal detectable change is 9 points.  Score 80 79-63 62-48 47-32 31-16 15-1 0   Modifier CH CI CJ CK CL CM CN       Observation/palpation/joint mobility: Pt with tenderness upon palpation of left patellar tendon, fat pad, greater trochanteric bursa, and gluteal tendons.  Mild swelling noted in anterior knee.     Range of Motion:   Left* Right   Extension  0 +3   Flexion  140 137     Strength:   Date: 03/21/14   Manual Muscle Test out of 5   Hip Flexion L: 3, R: 4/5   Hip Abduction L: 3, R: 3/5   Hip Extension Not tested   Knee Extension L: 4, R: 4+/5   Knee Flexion L: 4, R: 4/5      Functional Tests:    Overhead Squat: dysfunctional, non-painful: anterior lean, decreased dorsiflexion  Single Limb Squat: dysfunctional, non-painful: anterior lean, mild valgus  Single Limb Stance: 10 seconds ea LE    TREATMENT:    (In addition to Assessment/Re-Assessment sessions the following treatments were rendered)  Therapeutic Exercise: (45 Minutes):  Exercises per grid below to  improve mobility, strength and dynamic movement of knee - left to improve functional bending, lifting and walking.  Required minimal verbal cues to promote proper body mechanics.  Progressed range and repetitions as indicated.     04/17/14 04/19/14    Activity/Exercise      Patient education -- Reviewed importance of regular HEP performance for maintenance of pain relief and ROM    Heel slides 2 mins --    Prone hangs -- 4 mins    ITB stretch -- --    Adductor stretch Standing lunge: 3 x 30 sec hold  --    Calf stretch -- --    SLR flexion 3 x 10 3 x 10    SLR abduction 3 x 10 --    Clam shells -- --    Side plank (modified) -- 3 reps  to fatigue    bridges -- --    Knee/hip flexion with heels on ball -- --    TKE ball on wall 3 x 10 green strap 3 x 10 with green strap    Shuttle  Single leg: 3 x 10, 4 cords Double leg with 6 cords, single leg with 4 cords    squats To mat table: 3 x 10 --    Sidestepping and monsterwalking 2 laps x 20 feet, red tB --    Standing 3-way SLR -- 3 x 10 each side    Step ups  -- Forward/lateral: 10 each-discontinued due to discomfort         Manual Therapy (    Soft Tissue Mobilization Duration  Duration: 15 Minutes): Manual techniques to facilitate improved motion and decreased pain.  ?? 4-way patellar mobilizations and edu for HEP performance   ?? Patellar pinch taping for patellar tendon/anterior interval mobility  ?? Anterior interval mobilization   ?? Anterior tibial mobilization in flexion and approaching extension (improved anterior interval pain)  Therapeutic Modalities: Ice Massage x 5 mins                                                                                              HEP: As above; handouts given to patient for all exercises.  ______________________________________________________________________________________________________    Treatment Assessment:  Pt reported that she has not performed her home program since her last visit.  Pt presents with increased pain and  decreased ROM as a result.  Reviewed importance of home program again with patient.  Pt regained ROM this visit and reported improved pain.  Pt continues to have pain with steps.   Progression/Medical Necessity:   ?? Patient is expected to demonstrate progress in strength, range of motion and functional technique to increase independence with walking and working out.  Compliance with Program/Exercises: compliant most of the time.   Reason for Continuation of Services/Other Comments:  ?? Patient continues to require present interventions due to patient's inability to walk desired amounts .  Recommendations/Intent for next treatment session: "Treatment next visit will focus on advancements to more challenging activities".    Total Treatment Duration:  PT Patient Time In/Time Out  Time In: 0730  Time Out: 0830    Luiz Iron, PT

## 2014-04-24 NOTE — Progress Notes (Signed)
Therapy Center at [facility] [address]   [address], [place], [place] [address]  [phone]   Fax:[phone]  Outpatient PHYSICAL THERAPY: Daily Note and Progress Report  Fall Risk Score: 0 (? 5 = High Risk)      Treatment Diagnosis: pain in joint, lower leg  Treatment Diagnosis 2: stiffness of joint not elsewhere classified involving unspecified site  REFERRING PHYSICIAN: Herschel Senegal, MD  MD Orders: evaluate and treat left knee pain and left hip weakness   Return Physician Appointment: 1 month from evaluation  MEDICAL/REFERRING DIAGNOSIS: see above  DATE OF ONSET: chronic problem with recent exacerbation with initiating exercise   PRIOR LEVEL OF FUNCTION: able to walk and perform work activities without pain  PRECAUTIONS/ALLERGIES: Allergies no known allergies    ASSESSMENT:  ????????This section established at most recent assessment??????????  03/21/14: Pt presents to PT with report of L knee pain with signs and symptoms consistent with patellofemoral pain syndrome.  She reports long history of knee pain and joint stiffness that worsened recently when she tried to initiate working out. She demonstrates decreased knee flexion and extension ROM, decreased patellar mobility, and tenderness upon palpation of patellar tendon and fat pad.  She demonstrates decreased strength in R lower quarter, hip and knee, with mild tenderness upon palpation of greater trochanter bursa and gluteal tendons.  Pt with limitation in standing, walking, and exercise performance as result of above-listed impairments and with good prognosis for improvement with skilled PT.    04/24/14: Pt reports improvement in pain with standing, ambulation, and stair ambulation.  She reports that she is still limited in exercise performance.  Plan to continue 3 weeks to facilitate continued progress in pain and function.   PROBLEM LIST (Impairments causing functional limitations):  1. Decreased Strength affecting function  2.  Decreased ADL/Functional Activities  3. Decreased Flexibility/joint mobility  4. Increased Pain affecting function  5. Decreased Activity tolerance   GOALS: (Goals have been discussed and agreed upon with patient.)  SHORT-TERM FUNCTIONAL GOALS: Time Frame: by 04/18/14  1. Pt will report no limitation in standing for 1 hour due to pain. Achieved 04/24/14  2. Pt will report no pain with walking 20 minutes. Achieved 04/24/14  DISCHARGE GOALS: Time Frame: by 05/02/14  1. Pt will report no limitation in performing light workout for 1 hour. ONGOING  2. Pt will demonstrate functional improvement with LEFS to 70/80. ONGOING  REHABILITATION POTENTIAL FOR STATED GOALS: GoodPLAN OF CARE:  INTERVENTIONS PLANNED: (Benefits and precautions of physical therapy have been discussed with the patient.)  1. electrical stimulation  2. home exercise program (HEP)  3. manual therapy  4. range of motion: active/assisted/passive  5. therapeutic activities  6. therapeutic exercise/strengthening  TREATMENT PLAN EFFECTIVE DATES: 03/21/14 TO 05/02/14  FREQUENCY/DURATION: Follow patient 2 times a week for 6 weeks to address above goals.  Regarding Sayre M Alegria's therapy, I certify that the treatment plan above will be carried out by a therapist or under their direction.  Thank you for this referral,  Luiz Iron, PT     Referring Physician Signature: Herschel Senegal, MD          Date                       SUBJECTIVE:  History of Present Injury/Illness (Reason for Referral): 03/21/14: Pt reports to therapy with chronic L knee pain that she has had for years, but worsened recently when she initiated workout at  [employer].  She reports long history of not being able to bend or straighten her knee all the way.  She also reports L hip pain that she has had on and off for 9 years (since pregnancy).  She reports current pain 0/10, but pain that begins with walking 10-15 minutes and limits her walking > 1 mile.  She reports pain with standing >1 hour.  She  reports that pain progressively worsens when she is more active. Pt goals include returning to workout program.   Present Symptoms: Pt reports that with more consistency with her home exercises, that she is having much less pain.  She reports that her walking and standing feels unlimited and that she has been able to go up and down stairs without pain.    Pain Intensity 1: 0  Dominant Side: right  Past Medical History:   Past Medical History   Diagnosis Date   ??? Depressive disorder, not elsewhere classified    ??? Contact dermatitis and other eczema due to plants (except food)      Current Medications:   No current outpatient prescriptions on file prior to encounter.     No current facility-administered medications on file prior to encounter.      Date Last Reviewed: 04/24/2014    Social History/Home Situation: Pt lives in private home.       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   OBJECTIVE:  Outcome Measure:   Tool Used: Lower Extremity Functional Scale (LEFS)  Score:  Initial: 55/80 04/24/14: 60/80   Interpretation of Score: 20 questions each scored on a 5 point scale with 0 representing "extreme difficulty or unable to perform" and 4 representing "no difficulty".  The lower the score, the greater the functional disability. 80/80 represents no disability.  Minimal detectable change is 9 points.    Observation/palpation/joint mobility: Pt with tenderness upon palpation of left patellar tendon, fat pad, greater trochanteric bursa, and gluteal tendons.  Mild swelling noted in anterior knee.     Range of Motion:   Left* Right   Extension  0 +3   Flexion  140 137     Strength:   Date: 04/24/14   Manual Muscle Test out of 5   Hip Flexion L: 4,  R: 4/5   Hip Abduction L: 4, R: 3/5   Hip Extension Not tested   Knee Extension L: 4, R: 4+/5   Knee Flexion L: 4, R: 4/5      Functional Tests:    Overhead Squat: dysfunctional, non-painful:  anterior lean, decreased dorsiflexion  Single Limb Squat: dysfunctional, non-painful: anterior lean, mild valgus  Single Limb Stance: 10 seconds ea LE    TREATMENT:    (In addition to Assessment/Re-Assessment sessions the following treatments were rendered)  Therapeutic Exercise: (40 Minutes):  Exercises per grid below to improve mobility, strength and dynamic movement of knee - left to improve functional bending, lifting and walking.  Required minimal verbal cues to promote proper body mechanics.  Progressed range and repetitions as indicated.     04/17/14 04/19/14 04/24/14   Activity/Exercise      Patient education -- Reviewed importance of regular HEP performance for maintenance of pain relief and ROM --   Heel slides 2 mins -- --   Prone hangs -- 4 mins 4 mins   ITB stretch -- -- --   Adductor stretch Standing lunge: 3 x 30 sec hold  -- --  Calf stretch -- --    SLR flexion 3 x 10 3 x 10 3 x 10   SLR abduction 3 x 10 -- --   Clam shells -- -- --   Side plank (modified) -- 3 reps to fatigue 3 reps to fatigue   bridges -- -- --   Knee/hip flexion with heels on ball -- -- --   TKE ball on wall 3 x 10 green strap 3 x 10 with green strap --   Shuttle  Single leg: 3 x 10, 4 cords Double leg with 6 cords, single leg with 4 cords --   squats To mat table: 3 x 10 -- To mat table: 3 x 10    Sidestepping and monsterwalking 2 laps x 20 feet, red tB -- 2 laps x 20 feet, red TB   Standing 3-way SLR -- 3 x 10 each side Reviewed for HEP   Step ups  -- Forward/lateral: 10 each-discontinued due to discomfort  Lateral: 3 x 10 6 in step (no discomfort)   Split squats -- -- L LE forward only: 2 x 10   Inverted T's  -- -- 2 x 10 single leg       Manual Therapy (    Soft Tissue Mobilization Duration  Duration: 15 Minutes): Manual techniques to facilitate improved motion and decreased pain.  ?? 4-way patellar mobilizations and edu for HEP performance   ?? Patellar pinch taping for patellar tendon/anterior interval mobility  ?? Anterior  interval mobilization   ?? Anterior tibial mobilization in flexion and approaching extension (improved anterior interval pain)  Therapeutic Modalities: Ice Massage x 5 mins                                                                                              HEP: As above; handouts given to patient for all exercises.  ______________________________________________________________________________________________________    Treatment Assessment:  Pt with good improvement since last week with improved HEP performance.  Pt with improved standing, walking, and stair ambulation tolerance.  Plan to continue to progress as tolerated.    Progression/Medical Necessity:   ?? Patient is expected to demonstrate progress in strength, range of motion and functional technique to increase independence with walking and working out.  Compliance with Program/Exercises: compliant most of the time.   Reason for Continuation of Services/Other Comments:  ?? Patient continues to require present interventions due to patient's inability to walk desired amounts .  Recommendations/Intent for next treatment session: "Treatment next visit will focus on advancements to more challenging activities".    Total Treatment Duration:  PT Patient Time In/Time Out  Time In: 1535  Time Out: [address]1630    Joneisha Miles D Nahomi Hegner, PT

## 2014-05-01 NOTE — Progress Notes (Signed)
[facility] [place]   [address], [place], [place] [address]  Phone:[phone]   [phone]    DATE: 05/01/2014    Patient canceled for appointment today due to sick child.  Will plan to follow up on next scheduled visit.      Teddy SpikeLaurel Khambrel Amsden, PT, DPT, OCS

## 2014-05-07 NOTE — Progress Notes (Signed)
[facility] [address]   [address], [place], [place] [address]  [phone]   Fax:[phone]    DATE: 05/07/2014    Patient canceled for appointment today due to conflict.  Will plan to follow up on next scheduled visit.      Teddy SpikeLaurel Rosaria Kubin, PT, DPT, OCS

## 2014-06-12 NOTE — Progress Notes (Signed)
[facility] [address]   [address], [place], [place] [address]  Phone:[phone]   [phone]  Outpatient PHYSICAL THERAPY: Discontinuation Summary  Fall Risk Score: 0 (? 5 = High Risk)      Treatment Diagnosis: pain in joint, lower leg  Treatment Diagnosis 2: stiffness of joint not elsewhere classified involving unspecified site  REFERRING PHYSICIAN: Herschel SenegalKoch, Benjamin S, MD  MD Orders: evaluate and treat left knee pain and left hip weakness   Return Physician Appointment: 1 month from evaluation  MEDICAL/REFERRING DIAGNOSIS: see above  DATE OF ONSET: chronic problem with recent exacerbation with initiating exercise   PRIOR LEVEL OF FUNCTION: able to walk and perform work activities without pain  PRECAUTIONS/ALLERGIES: Allergies no known allergies    ASSESSMENT:  ????????This section established at most recent assessment??????????  03/21/14: Pt presents to PT with report of L knee pain with signs and symptoms consistent with patellofemoral pain syndrome.  She reports long history of knee pain and joint stiffness that worsened recently when she tried to initiate working out. She demonstrates decreased knee flexion and extension ROM, decreased patellar mobility, and tenderness upon palpation of patellar tendon and fat pad.  She demonstrates decreased strength in R lower quarter, hip and knee, with mild tenderness upon palpation of greater trochanter bursa and gluteal tendons.  Pt with limitation in standing, walking, and exercise performance as result of above-listed impairments and with good prognosis for improvement with skilled PT.    04/24/14: Pt reports improvement in pain with standing, ambulation, and stair ambulation.  She reports that she is still limited in exercise performance.  Plan to continue 3 weeks to facilitate continued progress in pain and function.   06/12/14: Pt canceled 2 appointments and was unavailable to reschedule  following last visit.  She had demonstrated good progress in therapy, but is discharged due to unable to attend therapy.  Pt is independent in HEP to perform.   PROBLEM LIST (Impairments causing functional limitations):  1. Decreased Strength affecting function  2. Decreased ADL/Functional Activities  3. Decreased Flexibility/joint mobility  4. Increased Pain affecting function  5. Decreased Activity tolerance   GOALS: (Goals have been discussed and agreed upon with patient.)  SHORT-TERM FUNCTIONAL GOALS: Time Frame: by 04/18/14  1. Pt will report no limitation in standing for 1 hour due to pain. Achieved 04/24/14  2. Pt will report no pain with walking 20 minutes. Achieved 04/24/14  DISCHARGE GOALS: Time Frame: by 05/02/14  1. Pt will report no limitation in performing light workout for 1 hour. Did not achieve at time of last attended visit.   2. Pt will demonstrate functional improvement with LEFS to 70/80. Did not achieve at time of last attended visit.  REHABILITATION POTENTIAL FOR STATED GOALS: Good                SUBJECTIVE:  History of Present Injury/Illness (Reason for Referral): 03/21/14: Pt reports to therapy with chronic L knee pain that she has had for years, but worsened recently when she initiated workout at [employer].  She reports long history of not being able to bend or straighten her knee all the way.  She also reports L hip pain that she has had on and off for 9 years (since pregnancy).  She reports current pain 0/10, but pain that begins with walking 10-15 minutes and limits her walking > 1 mile.  She reports pain with standing >1 hour.  She reports that pain progressively worsens when she is  more active. Pt goals include returning to workout program.   Present Symptoms: Pt reports that with more consistency with her home exercises, that she is having much less pain.  She reports that her walking and standing feels unlimited and that she has been able to go up and down stairs without pain.     Pain Intensity 1: 0  Dominant Side: right  Past Medical History:   Past Medical History   Diagnosis Date   ??? Depressive disorder, not elsewhere classified    ??? Contact dermatitis and other eczema due to plants (except food)      Current Medications:   No current outpatient prescriptions on file prior to encounter.     No current facility-administered medications on file prior to encounter.      Date Last Reviewed: 06/12/2014    Social History/Home Situation: Pt lives in private home.       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   OBJECTIVE:  Outcome Measure:   Tool Used: Lower Extremity Functional Scale (LEFS)  Score:  Initial: 55/80 04/24/14: 60/80   Interpretation of Score: 20 questions each scored on a 5 point scale with 0 representing "extreme difficulty or unable to perform" and 4 representing "no difficulty".  The lower the score, the greater the functional disability. 80/80 represents no disability.  Minimal detectable change is 9 points.    Observation/palpation/joint mobility: Pt with tenderness upon palpation of left patellar tendon, fat pad, greater trochanteric bursa, and gluteal tendons.  Mild swelling noted in anterior knee.     Range of Motion:   Left* Right   Extension  0 +3   Flexion  140 137     Strength:   Date: 04/24/14   Manual Muscle Test out of 5   Hip Flexion L: 4,  R: 4/5   Hip Abduction L: 4, R: 3/5   Hip Extension Not tested   Knee Extension L: 4, R: 4+/5   Knee Flexion L: 4, R: 4/5      Functional Tests:    Overhead Squat: dysfunctional, non-painful: anterior lean, decreased dorsiflexion  Single Limb Squat: dysfunctional, non-painful: anterior lean, mild valgus  Single Limb Stance: 10 seconds ea LE      Luiz Iron, PT

## 2015-10-30 ENCOUNTER — Ambulatory Visit
Admit: 2015-10-30 | Discharge: 2015-10-30 | Payer: BLUE CROSS/BLUE SHIELD | Attending: Family Medicine | Primary: Family Medicine

## 2015-10-30 DIAGNOSIS — R5382 Chronic fatigue, unspecified: Secondary | ICD-10-CM

## 2015-10-30 MED ORDER — CEFDINIR 300 MG CAP
300 mg | ORAL_CAPSULE | Freq: Two times a day (BID) | ORAL | 0 refills | Status: DC
Start: 2015-10-30 — End: 2019-01-14

## 2015-10-30 NOTE — Patient Instructions (Addendum)
Bronchitis: Care Instructions  Your Care Instructions    Bronchitis is inflammation of the bronchial tubes, which carry air to the lungs. The tubes swell and produce mucus, or phlegm. The mucus and inflamed bronchial tubes make you cough. You may have trouble breathing.  Most cases of bronchitis are caused by viruses like those that cause colds. Antibiotics usually do not help and they may be harmful.  Bronchitis usually develops rapidly and lasts about 2 to 3 weeks in otherwise healthy people.  Follow-up care is a key part of your treatment and safety. Be sure to make and go to all appointments, and call your doctor if you are having problems. It's also a good idea to know your test results and keep a list of the medicines you take.  How can you care for yourself at home?  ?? Take all medicines exactly as prescribed. Call your doctor if you think you are having a problem with your medicine.  ?? Get some extra rest.  ?? Take an over-the-counter pain medicine, such as acetaminophen (Tylenol), ibuprofen (Advil, Motrin), or naproxen (Aleve) to reduce fever and relieve body aches. Read and follow all instructions on the label.  ?? Do not take two or more pain medicines at the same time unless the doctor told you to. Many pain medicines have acetaminophen, which is Tylenol. Too much acetaminophen (Tylenol) can be harmful.  ?? Take an over-the-counter cough medicine that contains dextromethorphan to help quiet a dry, hacking cough so that you can sleep. Avoid cough medicines that have more than one active ingredient. Read and follow all instructions on the label.  ?? Breathe moist air from a humidifier, hot shower, or sink filled with hot water. The heat and moisture will thin mucus so you can cough it out.  ?? Do not smoke. Smoking can make bronchitis worse. If you need help quitting, talk to your doctor about stop-smoking programs and medicines. These can increase your chances of quitting for good.   When should you call for help?  Call 911 anytime you think you may need emergency care. For example, call if:  ?? You have severe trouble breathing.  Call your doctor now or seek immediate medical care if:  ?? You have new or worse trouble breathing.  ?? You cough up dark brown or bloody mucus (sputum).  ?? You have a new or higher fever.  ?? You have a new rash.  Watch closely for changes in your health, and be sure to contact your doctor if:  ?? You cough more deeply or more often, especially if you notice more mucus or a change in the color of your mucus.  ?? You are not getting better as expected.  Where can you learn more?  Go to http://www.healthwise.net/GoodHelpConnections.  Enter H333 in the search box to learn more about "Bronchitis: Care Instructions."  Current as of: Apr 01, 2015  Content Version: 11.1  ?? 2006-2016 [employer]. Care instructions adapted under license by [employer] (which disclaims liability or warranty for this information). If you have questions about a medical condition or this instruction, always ask your healthcare professional. Healthwise, Incorporated disclaims any warranty or liability for your use of this information.

## 2015-10-30 NOTE — Progress Notes (Signed)
[facility]  _______________________________________  Kristina Marseilles, MD                 [address]        [place] Karl Luke, MD                     [place], [place] [address]                                                                                    Phone: [phone]                                                                                    Fax: [phone]    Kristina Huber is a 49 y.o. female who is seen for evaluation of   Chief Complaint   Patient presents with   ??? Cough   ??? Fatigue       HPI:   Cough   The history is provided by the patient. This is a new problem. Episode onset: last few weeks, congestion and cough and some wheezes at times.   Fatigue   Episode onset: last few months, not sure of cause, not getting much rest with stress of family responsibilities.   Memory Loss    This is a new problem. Episode onset: last few weeks has noted this, hard time with name finding and forgetting responsibilities. Pertinent negatives include no confusion, no somnolence, no seizures, no unresponsiveness, no weakness, no delusions and no hallucinations.       Review of Systems:  Review of Systems   Constitutional: Positive for fatigue.   Eyes: Negative.    Respiratory: Positive for cough.    Cardiovascular: Negative.    Neurological: Negative for seizures and weakness.   Psychiatric/Behavioral: Positive for memory loss. Negative for confusion and hallucinations.       History:  Past Medical History   Diagnosis Date   ??? 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 Depressive disorder, not elsewhere classified        History reviewed. No pertinent past surgical history.    History reviewed. No pertinent family history.    Social History   Substance Use Topics   ??? Smoking status: Never Smoker   ??? Smokeless tobacco: Not on file   ??? Alcohol use Not on file       No Known Allergies    Current Outpatient Prescriptions   Medication Sig Dispense Refill    ??? amphetamine-dextroamphetamine XR (ADDERALL XR) 20 mg XR capsule take 2 capsules by mouth twice a day  0   ??? buPROPion XL (WELLBUTRIN XL) 300 mg XL tablet TAKE ONE TABLET BY MOUTH ONE TIME DAILY  5   ??? cefdinir (OMNICEF) 300 mg capsule Take 1 Cap  by mouth two (2) times a day. 14 Cap 0       Vitals:    Visit Vitals   ??? BP 130/86   ??? Ht 5\' 7"  (1.702 m)   ??? Wt 186 lb (84.4 kg)   ??? BMI 29.13 kg/m2       Physical Exam:  Physical Exam   Constitutional: She is oriented to person, place, and time. She appears well-developed and well-nourished.   HENT:   Head: Normocephalic.   Eyes: Pupils are equal, round, and reactive to light.   Neck: Normal range of motion. Neck supple.   Cardiovascular: Normal rate, regular rhythm and normal heart sounds.    Pulmonary/Chest: Effort normal and breath sounds normal.   Abdominal: Soft. Bowel sounds are normal.   Neurological: She is alert and oriented to person, place, and time. No cranial nerve deficit. Coordination normal.   Skin: Skin is warm and dry.   Psychiatric: She has a normal mood and affect. Judgment normal.       Assessment/Plan:     ICD-10-CM ICD-9-CM    1. Chronic fatigue R53.82 780.79 CBC WITH AUTOMATED DIFF      METABOLIC PANEL, COMPREHENSIVE      TSH 3RD GENERATION      VITAMIN B12 & FOLATE   2. Acute bronchitis due to other specified organisms J20.8 466.0 cefdinir (OMNICEF) 300 mg capsule   3. Memory loss R41.3 780.93 CBC WITH AUTOMATED DIFF      METABOLIC PANEL, COMPREHENSIVE      TSH 3RD GENERATION      VITAMIN B12 & FOLATE     1. Fatigue; check labs and encourage healthy diet and exercise and fluids and rest and MVI  2. Bronchitis: meds sent for infection  3. Memory issues: maybe stress or ADD related but labs checked for this as well    Kristina MarseillesAndrew S Bitha Fauteux, MD

## 2015-10-31 LAB — CBC WITH AUTOMATED DIFF
ABS. BASOPHILS: 0 10*3/uL (ref 0.0–0.2)
ABS. EOSINOPHILS: 0.3 10*3/uL (ref 0.0–0.4)
ABS. IMM. GRANS.: 0 10*3/uL (ref 0.0–0.1)
ABS. MONOCYTES: 0.6 10*3/uL (ref 0.1–0.9)
ABS. NEUTROPHILS: 3.6 10*3/uL (ref 1.4–7.0)
Abs Lymphocytes: 1.7 10*3/uL (ref 0.7–3.1)
BASOPHILS: 0 %
EOSINOPHILS: 4 %
HCT: 41.6 % (ref 34.0–46.6)
HGB: 14.2 g/dL (ref 11.1–15.9)
IMMATURE GRANULOCYTES: 0 %
Lymphocytes: 27 %
MCH: 31.1 pg (ref 26.6–33.0)
MCHC: 34.1 g/dL (ref 31.5–35.7)
MCV: 91 fL (ref 79–97)
MONOCYTES: 9 %
NEUTROPHILS: 60 %
PLATELET: 330 10*3/uL (ref 150–379)
RBC: 4.56 x10E6/uL (ref 3.77–5.28)
RDW: 13.5 % (ref 12.3–15.4)
WBC: 6.2 10*3/uL (ref 3.4–10.8)

## 2015-10-31 LAB — METABOLIC PANEL, COMPREHENSIVE
A-G Ratio: 1.8 (ref 1.1–2.5)
ALT (SGPT): 29 IU/L (ref 0–32)
AST (SGOT): 26 IU/L (ref 0–40)
Albumin: 4.4 g/dL (ref 3.5–5.5)
Alk. phosphatase: 79 IU/L (ref 39–117)
BUN/Creatinine ratio: 22 (ref 9–23)
BUN: 15 mg/dL (ref 6–24)
Bilirubin, total: 0.6 mg/dL (ref 0.0–1.2)
CO2: 24 mmol/L (ref 18–29)
Calcium: 9.4 mg/dL (ref 8.7–10.2)
Chloride: 105 mmol/L (ref 96–106)
Creatinine: 0.68 mg/dL (ref 0.57–1.00)
GFR est AA: 120 mL/min/{1.73_m2} (ref >59)
GFR est non-AA: 105 mL/min/{1.73_m2} (ref >59)
GLOBULIN, TOTAL: 2.5 g/dL (ref 1.5–4.5)
Glucose: 85 mg/dL (ref 65–99)
Potassium: 4.2 mmol/L (ref 3.5–5.2)
Protein, total: 6.9 g/dL (ref 6.0–8.5)
Sodium: 143 mmol/L (ref 134–144)

## 2015-10-31 LAB — TSH 3RD GENERATION: TSH: 1.77 u[IU]/mL (ref 0.450–4.500)

## 2015-10-31 LAB — VITAMIN B12 & FOLATE
Folate: 17.7 ng/mL (ref >3.0)
Vitamin B12: 602 pg/mL (ref 211–946)

## 2015-10-31 NOTE — Progress Notes (Signed)
Patient notified.

## 2016-05-15 ENCOUNTER — Emergency Department
Admission: EM | Admit: 2016-05-15 | Discharge: 2016-05-15 | Disposition: A | Discharge status: Home / Self Care | Payer: BLUE CROSS/BLUE SHIELD | Attending: Emergency Medicine | Admitting: Emergency Medicine

## 2016-05-15 ENCOUNTER — Emergency Department: Payer: BLUE CROSS/BLUE SHIELD

## 2016-05-15 ENCOUNTER — Encounter: Payer: Self-pay | Admitting: Emergency Medicine

## 2016-05-15 DIAGNOSIS — F329 Major depressive disorder, single episode, unspecified: Secondary | ICD-10-CM | POA: Diagnosis not present

## 2016-05-15 DIAGNOSIS — R3129 Other microscopic hematuria: Secondary | ICD-10-CM | POA: Diagnosis not present

## 2016-05-15 DIAGNOSIS — R109 Unspecified abdominal pain: Secondary | ICD-10-CM | POA: Diagnosis not present

## 2016-05-15 DIAGNOSIS — S50311A Abrasion of right elbow, initial encounter: Secondary | ICD-10-CM

## 2016-05-15 DIAGNOSIS — S8992XA Unspecified injury of left lower leg, initial encounter: Secondary | ICD-10-CM | POA: Diagnosis present

## 2016-05-15 DIAGNOSIS — S39012A Strain of muscle, fascia and tendon of lower back, initial encounter: Secondary | ICD-10-CM

## 2016-05-15 DIAGNOSIS — N200 Calculus of kidney: Secondary | ICD-10-CM | POA: Diagnosis not present

## 2016-05-15 DIAGNOSIS — Y939 Activity, unspecified: Secondary | ICD-10-CM | POA: Insufficient documentation

## 2016-05-15 DIAGNOSIS — F909 Attention-deficit hyperactivity disorder, unspecified type: Secondary | ICD-10-CM | POA: Insufficient documentation

## 2016-05-15 DIAGNOSIS — Y999 Unspecified external cause status: Secondary | ICD-10-CM | POA: Diagnosis not present

## 2016-05-15 DIAGNOSIS — S82102A Unspecified fracture of upper end of left tibia, initial encounter for closed fracture: Secondary | ICD-10-CM | POA: Diagnosis not present

## 2016-05-15 DIAGNOSIS — S5001XA Contusion of right elbow, initial encounter: Secondary | ICD-10-CM

## 2016-05-15 DIAGNOSIS — Y92481 Parking lot as the place of occurrence of the external cause: Secondary | ICD-10-CM | POA: Diagnosis not present

## 2016-05-15 DIAGNOSIS — S82142A Displaced bicondylar fracture of left tibia, initial encounter for closed fracture: Secondary | ICD-10-CM

## 2016-05-15 HISTORY — DX: Attention-deficit hyperactivity disorder, unspecified type: F90.9

## 2016-05-15 HISTORY — DX: Depression, unspecified: F32.A

## 2016-05-15 HISTORY — DX: Major depressive disorder, single episode, unspecified: F32.9

## 2016-05-15 LAB — COMPREHENSIVE METABOLIC PANEL
ALBUMIN: 4.1 g/dL (ref 3.5–5.0)
ALK PHOS: 70 U/L (ref 38–126)
ALT: 53 U/L (ref 14–54)
ANION GAP: 6 (ref 5–15)
AST: 44 U/L — AB (ref 15–41)
BILIRUBIN TOTAL: 0.9 mg/dL (ref 0.3–1.2)
BUN: 16 mg/dL (ref 6–20)
CALCIUM: 9.1 mg/dL (ref 8.9–10.3)
CO2: 27 mmol/L (ref 22–32)
Chloride: 105 mmol/L (ref 101–111)
Creatinine, Ser: 0.85 mg/dL (ref 0.44–1.00)
GFR calc Af Amer: >60 mL/min (ref >60)
GLUCOSE: 99 mg/dL (ref 65–99)
Potassium: 4.3 mmol/L (ref 3.5–5.1)
Sodium: 138 mmol/L (ref 135–145)
TOTAL PROTEIN: 6.7 g/dL (ref 6.5–8.1)

## 2016-05-15 LAB — CBC WITH DIFFERENTIAL/PLATELET
BASOS ABS: 0.1 10*3/uL (ref 0–0.1)
BASOS PCT: 1 %
EOS PCT: 4 %
Eosinophils Absolute: 0.3 10*3/uL (ref 0–0.7)
HCT: 40.4 % (ref 35.0–47.0)
Hemoglobin: 14.3 g/dL (ref 12.0–16.0)
Lymphocytes Relative: 21 %
Lymphs Abs: 1.4 10*3/uL (ref 1.0–3.6)
MCH: 32.5 pg (ref 26.0–34.0)
MCHC: 35.3 g/dL (ref 32.0–36.0)
MCV: 91.9 fL (ref 80.0–100.0)
MONO ABS: 0.5 10*3/uL (ref 0.2–0.9)
Monocytes Relative: 8 %
Neutro Abs: 4.4 10*3/uL (ref 1.4–6.5)
Neutrophils Relative %: 66 %
PLATELETS: 254 10*3/uL (ref 150–440)
RBC: 4.39 MIL/uL (ref 3.80–5.20)
RDW: 12.5 % (ref 11.5–14.5)
WBC: 6.7 10*3/uL (ref 3.6–11.0)

## 2016-05-15 LAB — URINALYSIS COMPLETE WITH MICROSCOPIC (ARMC ONLY)
BILIRUBIN URINE: NEGATIVE
Bacteria, UA: NONE SEEN
GLUCOSE, UA: NEGATIVE mg/dL
HGB URINE DIPSTICK: NEGATIVE
KETONES UR: NEGATIVE mg/dL
LEUKOCYTES UA: NEGATIVE
NITRITE: NEGATIVE
Protein, ur: NEGATIVE mg/dL
SPECIFIC GRAVITY, URINE: 1.01 (ref 1.005–1.030)
WBC, UA: NONE SEEN WBC/hpf (ref 0–5)
pH: 6 (ref 5.0–8.0)

## 2016-05-15 MED ORDER — TETANUS-DIPHTH-ACELL PERTUSSIS 5-2.5-18.5 LF-MCG/0.5 IM SUSP
0.5000 mL | Freq: Once | INTRAMUSCULAR | Status: AC
  Administered 2016-05-15: 0.5 mL via INTRAMUSCULAR
  Filled 2016-05-15: qty 0.5

## 2016-05-15 MED ORDER — ONDANSETRON 4 MG PO TBDP
4.0000 mg | ORAL_TABLET | Freq: Once | ORAL | Status: AC
  Administered 2016-05-15: 4 mg via ORAL

## 2016-05-15 MED ORDER — HYDROMORPHONE HCL 1 MG/ML IJ SOLN
0.5000 mg | Freq: Once | INTRAMUSCULAR | Status: AC
  Administered 2016-05-15: 0.5 mg via INTRAVENOUS
  Filled 2016-05-15: qty 1

## 2016-05-15 MED ORDER — OXYCODONE-ACETAMINOPHEN 5-325 MG PO TABS: ORAL_TABLET | ORAL | Status: AC

## 2016-05-15 MED ORDER — SODIUM CHLORIDE 0.9 % IV SOLN
Freq: Once | INTRAVENOUS | Status: AC
Start: 2016-05-15 — End: 2016-05-15
  Administered 2016-05-15: 12:00:00 via INTRAVENOUS

## 2016-05-15 MED ORDER — ONDANSETRON 4 MG PO TBDP: 4.0000 mg | ORAL_TABLET | Freq: Three times a day (TID) | ORAL | Status: AC | PRN

## 2016-05-15 MED ORDER — IBUPROFEN 600 MG PO TABS
600.0000 mg | ORAL_TABLET | Freq: Once | ORAL | Status: AC
  Administered 2016-05-15: 600 mg via ORAL
  Filled 2016-05-15: qty 1

## 2016-05-15 MED ORDER — ONDANSETRON 4 MG PO TBDP
ORAL_TABLET | ORAL | Status: AC
  Filled 2016-05-15: qty 1

## 2016-05-15 MED ORDER — IBUPROFEN 600 MG PO TABS: 600.0000 mg | ORAL_TABLET | Freq: Three times a day (TID) | ORAL | Status: AC | PRN

## 2016-05-15 MED ORDER — IOPAMIDOL (ISOVUE-300) INJECTION 61%
100.0000 mL | Freq: Once | INTRAVENOUS | Status: AC | PRN
  Administered 2016-05-15: 100 mL via INTRAVENOUS
  Filled 2016-05-15: qty 100
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
## 2016-05-15 NOTE — Discharge Instructions (Signed)
[place] when you get home and make an appointment. No weightbearing on the left leg. Wear knee immobilizer and use crutches. Ice and elevation to reduce swelling and pain. Take Percocet one or 2 tablets every 4-6 hours as needed for pain. Beware that this could cause drowsiness and increase your risk for falling. Ibuprofen as needed for inflammation 1 tablet every 6 hours with food. Follow-up with your family doctor for reevaluation of the 4 mm nodule seen on your left lung on CT scan. Radiology recommendations is to recheck this and one year. Copies of x-rays was given to you to take to your orthopedist.
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
## 2017-03-02 ENCOUNTER — Ambulatory Visit
Admit: 2017-03-02 | Discharge: 2017-03-02 | Payer: BLUE CROSS/BLUE SHIELD | Attending: Family Medicine | Primary: Family Medicine

## 2017-03-02 DIAGNOSIS — M79621 Pain in right upper arm: Secondary | ICD-10-CM

## 2017-03-02 MED ORDER — TRIMETHOPRIM-SULFAMETHOXAZOLE 160 MG-800 MG TAB
160-800 mg | ORAL_TABLET | Freq: Two times a day (BID) | ORAL | 0 refills | Status: DC
Start: 2017-03-02 — End: 2019-01-14

## 2017-03-02 MED ORDER — HYDROCODONE-ACETAMINOPHEN 5 MG-325 MG TAB
5-325 mg | ORAL_TABLET | Freq: Three times a day (TID) | ORAL | 0 refills | Status: DC | PRN
Start: 2017-03-02 — End: 2019-01-14

## 2017-03-02 NOTE — Patient Instructions (Addendum)
Skin Abscess: Care Instructions  Your Care Instructions    A skin abscess is a bacterial infection that forms a pocket of pus. A boil is a kind of skin abscess. The doctor may have cut an opening in the abscess so that the pus can drain out. You may have gauze in the cut so that the abscess will stay open and keep draining. You may need antibiotics. You will need to follow up with your doctor to make sure the infection has gone away.  The doctor has checked you carefully, but problems can develop later. If you notice any problems or new symptoms, get medical treatment right away.  Follow-up care is a key part of your treatment and safety. Be sure to make and go to all appointments, and call your doctor if you are having problems. It's also a good idea to know your test results and keep a list of the medicines you take.  How can you care for yourself at home?  ?? Apply warm and dry compresses, a heating pad set on low, or a hot water bottle 3 or 4 times a day for pain. Keep a cloth between the heat source and your skin.  ?? If your doctor prescribed antibiotics, take them as directed. Do not stop taking them just because you feel better. You need to take the full course of antibiotics.  ?? Take pain medicines exactly as directed.  ?? If the doctor gave you a prescription medicine for pain, take it as prescribed.  ?? If you are not taking a prescription pain medicine, ask your doctor if you can take an over-the-counter medicine.  ?? Keep your bandage clean and dry. Change the bandage whenever it gets wet or dirty, or at least one time a day.  ?? If the abscess was packed with gauze:  ?? Keep follow-up appointments to have the gauze changed or removed. If the doctor instructed you to remove the gauze, gently pull out all of the gauze when your doctor tells you to.  ?? After the gauze is removed, soak the area in warm water for 15 to 20 minutes 2 times a day, until the wound closes.  When should you call for help?   Call your doctor now or seek immediate medical care if:  ? ?? You have signs of worsening infection, such as:  ?? Increased pain, swelling, warmth, or redness.  ?? Red streaks leading from the infected skin.  ?? Pus draining from the wound.  ?? A fever.   ?Watch closely for changes in your health, and be sure to contact your doctor if:  ? ?? You do not get better as expected.   Where can you learn more?  Go to http://www.healthwise.net/[employer]Help[employer].  Enter D633 in the search box to learn more about "Skin Abscess: Care Instructions."  Current as of: August 22, 2015  Content Version: 11.4  ?? 2006-2017 [employer]. Care instructions adapted under license by [employer] (which disclaims liability or warranty for this information). If you have questions about a medical condition or this instruction, always ask your healthcare professional. Healthwise, Inc[employer] disclaims any warranty or liability for your use of this information.

## 2017-03-02 NOTE — Progress Notes (Signed)
[facility]  _______________________________________  Cathlean Marseilles, MD                 [address]        [place]. Karl Luke, MD                     [place], [place] [address]                                                                                    Phone: [phone]                                                                                    Fax: [phone]    Talina KAJSA BUTRUM is a 51 y.o. female who is seen for evaluation of   Chief Complaint   Patient presents with   ??? Skin Problem     abscess under right arm       HPI:   Skin Problem   The history is provided by the patient. Episode onset: pain in right armpit for last few days, getting worse, no fever noted, has started to drain now with blood noted.        Review of Systems:  Review of Systems   Constitutional: Negative for chills, diaphoresis, fever and malaise/fatigue.   Skin:        Painful right arm pit lump         History:  Past Medical History:   Diagnosis Date   ??? 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 Depressive disorder, not elsewhere classified        No past surgical history on file.    No family history on file.    Social History   Substance Use Topics   ??? Smoking status: Never Smoker   ??? Smokeless tobacco: Not on file   ??? Alcohol use Not on file       No Known Allergies    Current Outpatient Prescriptions   Medication Sig Dispense Refill   ??? trimethoprim-sulfamethoxazole (BACTRIM DS, SEPTRA DS) 160-800 mg per tablet Take 1 Tab by mouth two (2) times a day. 20 Tab 0   ??? HYDROcodone-acetaminophen (NORCO) 5-325 mg per tablet Take 1 Tab by mouth every eight (8) hours as needed for Pain. Max Daily Amount: 3 Tabs. 10 Tab 0   ??? amphetamine-dextroamphetamine XR (ADDERALL XR) 20 mg XR capsule take 2 capsules by mouth twice a day  0   ??? buPROPion XL (WELLBUTRIN XL) 300 mg XL tablet TAKE ONE TABLET BY MOUTH ONE TIME DAILY  5    ??? cefdinir (OMNICEF) 300 mg capsule Take 1 Cap by mouth two (2) times a day. 14 Cap 0       Vitals:  Visit Vitals   ??? BP 142/80   ??? Ht  (1.702 m)   ??? Wt 195 lb (88.5 kg)   ??? BMI 30.54 kg/m2       Physical Exam:  Physical Exam   Constitutional: She appears well-developed and well-nourished.   HENT:   Head: Normocephalic.   Skin:   Large abscess right axilla, pain and swelling to touch, hot to touch and fluctuant, see procedure note below       Assessment/Plan:     ICD-10-CM ICD-9-CM    1. Pain in right axilla M79.621 729.5 HYDROcodone-acetaminophen (NORCO) 5-325 mg per tablet   2. Abscess of axilla, right L02.411 682.3 trimethoprim-sulfamethoxazole (BACTRIM DS, SEPTRA DS) 160-800 mg per tablet      DRAIN SKIN ABSCESS SIMPLE      PR HYDROCOLLD DRG <=16 IN W/BDR      SURGICAL TRAYS     After eval right armpit pain found large abscess  Prepped and draped after consent obtained, 1% lido plain used for local and 11 blade scalpel for I&D with copious pus removed, dressing in place, post care with hot compresses discussed  Call I fother problems  rehceck if not improving, ab sent as well      Cathlean Marseilles, MD
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
## 2019-01-14 ENCOUNTER — Inpatient Hospital Stay
Admit: 2019-01-14 | Discharge: 2019-01-14 | Disposition: A | Discharge status: Home / Self Care | Payer: BLUE CROSS/BLUE SHIELD | Attending: Emergency Medicine

## 2019-01-14 DIAGNOSIS — F430 Acute stress reaction: Secondary | ICD-10-CM

## 2019-01-14 LAB — EKG, 12 LEAD, INITIAL
Atrial Rate: 73 {beats}/min
Calculated P Axis: 43 degrees
Calculated R Axis: 57 degrees
Calculated T Axis: 30 degrees
Diagnosis: NORMAL
P-R Interval: 160 ms
Q-T Interval: 386 ms
QRS Duration: 86 ms
QTC Calculation (Bezet): 425 ms
Ventricular Rate: 73 {beats}/min

## 2019-01-14 LAB — EKG 12-LEAD
Atrial Rate: 73 {beats}/min
Diagnosis: NORMAL
P Axis: 43 degrees
P-R Interval: 160 ms
Q-T Interval: 386 ms
QRS Duration: 86 ms
QTc Calculation (Bazett): 425 ms
R Axis: 57 degrees
T Axis: 30 degrees
Ventricular Rate: 73 {beats}/min

## 2019-01-14 MED ORDER — HYDROXYZINE PAMOATE 25 MG CAP
25 mg | ORAL | Status: AC
Start: 2019-01-14 — End: 2019-01-14
  Administered 2019-01-14: 17:00:00 via ORAL

## 2019-01-14 MED FILL — HYDROXYZINE PAMOATE 25 MG CAP: 25 mg | ORAL | Qty: 2

## 2019-01-14 NOTE — ED Notes (Signed)

## 2019-01-14 NOTE — ED Provider Notes (Signed)
Patient is a 53 year old female who presents to the emergency department today secondary to possible panic attack.  The patient states she was shopping when all of a sudden part of her vision went blank.  The patient says she is had this happen to her before when she has had complex migraines.  The patient said that she was afraid that she was going to fall down so she just stood there and held onto her shopping cart.  Eventually the symptoms seem to be getting worse so then she walked out to her car and drove herself to the emergency department.  The patient said that she started having tingling in the hands which is improving but still present mildly.  The patient states that she takes Adderall and Wellbutrin and is in counseling after going through a divorce about a year and a half ago.  She says she has a lot of stress in her as her children are not supportive of her.           Past Medical History:   Diagnosis Date   ??? 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 Depressive disorder, not elsewhere classified        Past Surgical History:   Procedure Laterality Date   ??? HX GYN     ??? HX ORTHOPAEDIC           History reviewed. No pertinent family history.    Social History     Socioeconomic History   ??? Marital status: MARRIED     Spouse name: Not on file   ??? Number of children: Not on file   ??? Years of education: Not on file   ??? Highest education level: Not on file   Occupational History   ??? Not on file   Social Needs   ??? Financial resource strain: Not on file   ??? Food insecurity:     Worry: Not on file     Inability: Not on file   ??? Transportation needs:     Medical: Not on file     Non-medical: Not on file   Tobacco Use   ??? Smoking status: Never Smoker   ??? Smokeless tobacco: Never Used   Substance and Sexual Activity   ??? Alcohol use: Yes     Comment: rarely   ??? Drug use: Never   ??? Sexual activity: Not on file   Lifestyle   ??? Physical activity:     Days per week: Not on file     Minutes  per session: Not on file   ??? Stress: Not on file   Relationships   ??? Social connections:     Talks on phone: Not on file     Gets together: Not on file     Attends religious service: Not on file     Active member of club or organization: Not on file     Attends meetings of clubs or organizations: Not on file     Relationship status: Not on file   ??? Intimate partner violence:     Fear of current or ex partner: Not on file     Emotionally abused: Not on file     Physically abused: Not on file     Forced sexual activity: Not on file   Other Topics Concern   ??? Not on file   Social History Narrative   ??? Not on file  ALLERGIES: Patient has no known allergies.    Review of Systems   Constitutional: Negative.    HENT: Negative.    Neurological: Positive for numbness.   Psychiatric/Behavioral: The patient is nervous/anxious.        Vitals:    01/14/19 1112   BP: 169/87   Pulse: 81   Resp: 18   Temp: 98.2 ??F (36.8 ??C)   SpO2: 100%   Weight: 86.2 kg (190 lb)   Height: 5\' 7"  (1.702 m)            Physical Exam     GENERAL:The patient is overweight, and well-hydrated.  VITAL SIGNS: Heart rate, blood pressure, respiratory rate reviewed as recorded in  nurse's notes  EYES: Pupils reactive. Extraocular motion intact. No conjunctival redness or drainage.  NECK: Supple, no meningeal signs. Trachea midline. No masses or thyromegaly.  LUNGS: Breath sounds clear and equal bilaterally no accessory muscle use  CHEST: No deformity  CARDIOVASCULAR: Regular rate and rhythm  EXTREMITIES: No clubbing or cyanosis. No joint swelling. Normal muscle tone. No  restricted range of motion appreciated.  NEUROLOGIC: Sensation is grossly intact. Cranial nerve exam reveals face is  symmetrical, tongue is midline speech is clear.  SKIN: No rash or petechiae. Good skin turgor palpated.  PSYCHIATRIC: Alert and oriented.  No homicidal or suicidal ideations.  Patient is tearful and talks about the increased stress in her life.      MDM  Number of  Diagnoses or Management Options  Diagnosis management comments: major depression,  depression with anxiety, depression reactive to situation, depression, anxiety, panic  attack, hyperventilation syndrome, bipolar disorder,         Amount and/or Complexity of Data Reviewed  Tests in the radiology section of CPT??: ordered and reviewed  Tests in the medicine section of CPT??: ordered and reviewed  Independent visualization of images, tracings, or specimens: yes      ED Course as of Jan 14 1247   Sat Jan 14, 2019   1246 The patient's EKG looks okay.  I encouraged her to go home and take her normal medications right away.  I also encouraged her to follow-up with her family doctor and talk about the event that happened this afternoon.    [KH]      ED Course User Index  [KH] Natale Milch, DO       Procedures

## 2019-01-14 NOTE — ED Notes (Signed)
 Pt reports she was shopping and started feeling strange and came to the ED fearing she was very ill. Pt tearful, anxious, fearful.

## 2019-01-14 NOTE — ED Provider Notes (Signed)
Patient is a 53 year old female who presents to the emergency department today secondary to possible panic attack.  The patient states she was shopping when all of a sudden part of her vision went blank.  The patient says she is had this happen to her before when she has had complex migraines.  The patient said that she was afraid that she was going to fall down so she just stood there and held onto her shopping cart.  Eventually the symptoms seem to be getting worse so then she walked out to her car and drove herself to the emergency department.  The patient said that she started having tingling in the hands which is improving but still present mildly.  The patient states that she takes Adderall and Wellbutrin and is in counseling after going through a divorce about a year and a half ago.  She says she has a lot of stress in her as her children are not supportive of her.           Past Medical History:   Diagnosis Date   ??? 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 Depressive disorder, not elsewhere classified        Past Surgical History:   Procedure Laterality Date   ??? HX GYN     ??? HX ORTHOPAEDIC           History reviewed. No pertinent family history.    Social History     Socioeconomic History   ??? Marital status: MARRIED     Spouse name: Not on file   ??? Number of children: Not on file   ??? Years of education: Not on file   ??? Highest education level: Not on file   Occupational History   ??? Not on file   Social Needs   ??? Financial resource strain: Not on file   ??? Food insecurity:     Worry: Not on file     Inability: Not on file   ??? Transportation needs:     Medical: Not on file     Non-medical: Not on file   Tobacco Use   ??? Smoking status: Never Smoker   ??? Smokeless tobacco: Never Used   Substance and Sexual Activity   ??? Alcohol use: Yes     Comment: rarely   ??? Drug use: Never   ??? Sexual activity: Not on file   Lifestyle   ??? Physical activity:     Days per week: Not on file      Minutes per session: Not on file   ??? Stress: Not on file   Relationships   ??? Social connections:     Talks on phone: Not on file     Gets together: Not on file     Attends religious service: Not on file     Active member of club or organization: Not on file     Attends meetings of clubs or organizations: Not on file     Relationship status: Not on file   ??? Intimate partner violence:     Fear of current or ex partner: Not on file     Emotionally abused: Not on file     Physically abused: Not on file     Forced sexual activity: Not on file   Other Topics Concern   ??? Not on file   Social History Narrative   ??? Not on file  ALLERGIES: Patient has no known allergies.    Review of Systems   Constitutional: Negative.    HENT: Negative.    Neurological: Positive for numbness.   Psychiatric/Behavioral: The patient is nervous/anxious.        Vitals:    01/14/19 1112   BP: 169/87   Pulse: 81   Resp: 18   Temp: 98.2 ??F (36.8 ??C)   SpO2: 100%   Weight: 86.2 kg (190 lb)   Height: 5\' 7"  (1.702 m)            Physical Exam     GENERAL:The patient is overweight, and well-hydrated.  VITAL SIGNS: Heart rate, blood pressure, respiratory rate reviewed as recorded in  nurse's notes  EYES: Pupils reactive. Extraocular motion intact. No conjunctival redness or drainage.  NECK: Supple, no meningeal signs. Trachea midline. No masses or thyromegaly.  LUNGS: Breath sounds clear and equal bilaterally no accessory muscle use  CHEST: No deformity  CARDIOVASCULAR: Regular rate and rhythm  EXTREMITIES: No clubbing or cyanosis. No joint swelling. Normal muscle tone. No  restricted range of motion appreciated.  NEUROLOGIC: Sensation is grossly intact. Cranial nerve exam reveals face is  symmetrical, tongue is midline speech is clear.  SKIN: No rash or petechiae. Good skin turgor palpated.  PSYCHIATRIC: Alert and oriented.  No homicidal or suicidal ideations.  Patient is tearful and talks about the increased stress in her life.      MDM   Number of Diagnoses or Management Options  Diagnosis management comments: major depression,  depression with anxiety, depression reactive to situation, depression, anxiety, panic  attack, hyperventilation syndrome, bipolar disorder,         Amount and/or Complexity of Data Reviewed  Tests in the radiology section of CPT??: ordered and reviewed  Tests in the medicine section of CPT??: ordered and reviewed  Independent visualization of images, tracings, or specimens: yes      ED Course as of Jan 14 1247   Sat Jan 14, 2019   1246 The patient's EKG looks okay.  I encouraged her to go home and take her normal medications right away.  I also encouraged her to follow-up with her family doctor and talk about the event that happened this afternoon.    [KH]      ED Course User Index  [KH] Natale Milch, DO       Procedures

## 2019-01-14 NOTE — ED Triage Notes (Signed)
Pt reports she was shopping and started "feeling strange" and came to the ED fearing she was very ill. Pt tearful, anxious, fearful.

## 2019-01-14 NOTE — ED Notes (Signed)

## 2019-03-15 LAB — HM MAMMOGRAPHY

## 2020-06-28 ENCOUNTER — Ambulatory Visit: Primary: Family Medicine

## 2020-06-28 ENCOUNTER — Ambulatory Visit: Payer: BLUE CROSS/BLUE SHIELD | Primary: Family Medicine

## 2020-06-28 DIAGNOSIS — Z23 Encounter for immunization: Secondary | ICD-10-CM
